# Patient Record
Sex: Male | Born: 2000 | Hispanic: Yes | Marital: Single | Smoking: Never smoker
Health system: Southern US, Community
[De-identification: ages and names within clinical notes are randomized; demographics above are authoritative.]

---
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
## 2021-01-04 ENCOUNTER — Ambulatory Visit
Admission: RE | Admit: 2021-01-04 | Discharge: 2021-01-04 | Disposition: A | Discharge status: Home / Self Care | Payer: Medicaid Other | Source: Ambulatory Visit | Attending: Pediatrics | Admitting: Pediatrics

## 2021-01-04 ENCOUNTER — Other Ambulatory Visit: Payer: Self-pay | Admitting: Pediatrics

## 2021-01-04 DIAGNOSIS — N5082 Scrotal pain: Secondary | ICD-10-CM

## 2021-01-04 DIAGNOSIS — R102 Pelvic and perineal pain: Secondary | ICD-10-CM

## 2022-03-17 ENCOUNTER — Encounter (HOSPITAL_COMMUNITY): Payer: Self-pay

## 2022-03-17 ENCOUNTER — Ambulatory Visit (HOSPITAL_COMMUNITY)
Admission: EM | Admit: 2022-03-17 | Discharge: 2022-03-17 | Disposition: A | Discharge status: Home / Self Care | Payer: Medicaid Other | Attending: Family Medicine | Admitting: Family Medicine

## 2022-03-17 DIAGNOSIS — H66005 Acute suppurative otitis media without spontaneous rupture of ear drum, recurrent, left ear: Secondary | ICD-10-CM | POA: Diagnosis not present

## 2022-03-17 DIAGNOSIS — R051 Acute cough: Secondary | ICD-10-CM | POA: Diagnosis present

## 2022-03-17 DIAGNOSIS — R1013 Epigastric pain: Secondary | ICD-10-CM | POA: Insufficient documentation

## 2022-03-17 DIAGNOSIS — R079 Chest pain, unspecified: Secondary | ICD-10-CM | POA: Insufficient documentation

## 2022-03-17 DIAGNOSIS — Z20822 Contact with and (suspected) exposure to covid-19: Secondary | ICD-10-CM | POA: Insufficient documentation

## 2022-03-17 DIAGNOSIS — J029 Acute pharyngitis, unspecified: Secondary | ICD-10-CM | POA: Diagnosis not present

## 2022-03-17 LAB — SARS CORONAVIRUS 2 (TAT 6-24 HRS): SARS Coronavirus 2: NEGATIVE

## 2022-03-17 MED ORDER — BENZONATATE 100 MG PO CAPS: 100.0000 mg | ORAL_CAPSULE | Freq: Three times a day (TID) | ORAL | 0 refills | Status: AC

## 2022-03-17 MED ORDER — AMOXICILLIN-POT CLAVULANATE 875-125 MG PO TABS: 1.0000 | ORAL_TABLET | Freq: Two times a day (BID) | ORAL | 0 refills | Status: AC

## 2022-03-17 MED ORDER — OMEPRAZOLE 20 MG PO CPDR: 20.0000 mg | DELAYED_RELEASE_CAPSULE | Freq: Every day | ORAL | 0 refills | Status: AC

## 2022-03-17 NOTE — Discharge Instructions (Signed)
You were seen today for upper respiratory symptoms.  ?I have given you an antibiotic today, as well as a cough pill and a medication for your stomach.  Please take as directed.  ?If you take mediation, please take with food to avoid upset stomach.  ?Your covid swab will be resulted tomorrow and if positive we will call and notify you.  You will also see this on mychart.  ?Please return if you are not improving or worsening over time.  ?

## 2022-03-17 NOTE — ED Triage Notes (Signed)
Pt c/o cough, sore throat, nasal/chest congestion with nose bleeds since Sunday. States nose bleed this morning after waking that lasted <38mins. ?

## 2022-03-17 NOTE — ED Provider Notes (Signed)
?[facility] ? ? ? ?CSN: [id number] ?Arrival date & time: 03/17/22  2585 ? ? ?  ? ?History   ?Chief Complaint ?Chief Complaint  ?Patient presents with  ? Cough  ? ? ?HPI ?Fernando Moran is a 21 y.o. male.  ? ?Patient is here for 7 days of URI symptoms.  ?Started with sore throat x 5 days.  Then with cough, runny nose with blood at times.  Yesterday and today had random bloody nose from the left.  Also with chest pain and stomach pain today.  ?No wheezing or sob.  ?No n/v.  ?No diarrhea or constipation.  ?Last week he had fever, slight.  No chills. No other body aches.  ?He did have left ear pain 4 days ago, which resolved.  ?He has been taking otc meds without help.  ? ?History reviewed. No pertinent past medical history. ? ?There are no problems to display for this patient. ? ? ?History reviewed. No pertinent surgical history. ? ? ? ? ?Home Medications   ? ?Prior to Admission medications   ?Medication Sig Start Date End Date Taking? Authorizing Provider  ?diphenhydrAMINE-zinc acetate (BENADRYL) cream Apply topically 3 (three) times daily as needed for itching. 05/31/14   Presson, Mathis Fare, PA  ?famotidine (PEPCID) 20 MG tablet Take 1 tablet (20 mg total) by mouth daily. X 3 days 05/31/14   Presson, Mathis Fare, PA  ?naproxen (NAPROSYN) 250 MG tablet Take 1 tablet (250 mg total) by mouth 2 (two) times daily with a meal. 01/07/17   Deatra Canter, FNP  ? ? ?Family History ?History reviewed. No pertinent family history. ? ?Social History ?Social History  ? ?Tobacco Use  ? Smoking status: Never  ?  Passive exposure: Yes  ? Smokeless tobacco: Never  ?Substance Use Topics  ? Alcohol use: No  ? Drug use: No  ? ? ? ?Allergies   ?Patient has no known allergies. ? ? ?Review of Systems ?Review of Systems  ?Constitutional:  Positive for fever. Negative for chills.  ?HENT:  Positive for congestion, ear pain, rhinorrhea and sore throat.   ?Eyes: Negative.   ?Respiratory:  Positive for cough. Negative  for shortness of breath and wheezing.   ?Gastrointestinal:  Positive for abdominal pain.  ?Genitourinary: Negative.   ?Musculoskeletal: Negative.   ?Skin: Negative.   ? ? ?Physical Exam ?Triage Vital Signs ?ED Triage Vitals [03/17/22 1132]  ?Enc Vitals Group  ?   BP (!) 143/85  ?   Pulse Rate 69  ?   Resp 18  ?   Temp 98.3 ?F (36.8 ?C)  ?   Temp Source Oral  ?   SpO2 97 %  ?   Weight   ?   Height   ?   Head Circumference   ?   Peak Flow   ?   Pain Score 7  ?   Pain Loc   ?   Pain Edu?   ?   Excl. in GC?   ? ?No data found. ? ?Updated Vital Signs ?BP (!) 143/85 (BP Location: Left Arm)   Pulse 69   Temp 98.3 ?F (36.8 ?C) (Oral)   Resp 18   SpO2 97%  ? ?Visual Acuity ?Right Eye Distance:   ?Left Eye Distance:   ?Bilateral Distance:   ? ?Right Eye Near:   ?Left Eye Near:    ?Bilateral Near:    ? ?Physical Exam ?Constitutional:   ?   Appearance: Normal appearance.  ?HENT:  ?  Head: Atraumatic.  ?   Left Ear: Tympanic membrane is erythematous.  ?   Nose:  ?   Right Sinus: No maxillary sinus tenderness or frontal sinus tenderness.  ?   Left Sinus: No maxillary sinus tenderness or frontal sinus tenderness.  ?   Mouth/Throat:  ?   Pharynx: Oropharynx is clear. No oropharyngeal exudate or posterior oropharyngeal erythema.  ?Cardiovascular:  ?   Rate and Rhythm: Normal rate.  ?Pulmonary:  ?   Effort: Pulmonary effort is normal.  ?   Breath sounds: Normal breath sounds.  ?Abdominal:  ?   Palpations: Abdomen is soft.  ?   Tenderness: There is abdominal tenderness in the epigastric area.  ?Musculoskeletal:  ?   Cervical back: Normal range of motion. No tenderness.  ?Lymphadenopathy:  ?   Cervical: No cervical adenopathy.  ?Neurological:  ?   Mental Status: He is alert.  ? ? ? ?UC Treatments / Results  ?Labs ?(all labs ordered are listed, but only abnormal results are displayed) ?Labs Reviewed - No data to display ? ?EKG ? ? ?Radiology ?No results found. ? ?Procedures ?Procedures (including critical care time) ? ?Medications  Ordered in UC ?Medications - No data to display ? ?Initial Impression / Assessment and Plan / UC Course  ?I have reviewed the triage vital signs and the nursing notes. ? ?Pertinent labs & imaging results that were available during my care of the patient were reviewed by me and considered in my medical decision making (see chart for details). ? ? ?Final Clinical Impressions(s) / UC Diagnoses  ? ?Final diagnoses:  ?Recurrent acute suppurative otitis media without spontaneous rupture of left tympanic membrane  ?Acute cough  ?Epigastric pain  ? ? ? ?Discharge Instructions   ? ?  ?You were seen today for upper respiratory symptoms.  ?I have given you an antibiotic today, as well as a cough pill and a medication for your stomach.  Please take as directed.  ?If you take mediation, please take with food to avoid upset stomach.  ?Your covid swab will be resulted tomorrow and if positive we will call and notify you.  You will also see this on mychart.  ?Please return if you are not improving or worsening over time.  ? ? ? ?ED Prescriptions   ? ? Medication Sig Dispense Auth. Provider  ? amoxicillin-clavulanate (AUGMENTIN) 875-125 MG tablet Take 1 tablet by mouth every 12 (twelve) hours. 14 tablet Rhealynn Myhre, MD  ? benzonatate (TESSALON) 100 MG capsule Take 1 capsule (100 mg total) by mouth every 8 (eight) hours. 21 capsule Ferlando Lia, MD  ? omeprazole (PRILOSEC) 20 MG capsule Take 1 capsule (20 mg total) by mouth daily for 14 days. 14 capsule Jannifer Franklin, MD  ? ?  ? ?PDMP not reviewed this encounter. ?  Jannifer Franklin, MD ?03/17/22 1301 ? ?

## 2022-10-28 IMAGING — US US SCROTUM W/ DOPPLER COMPLETE
1 series · 14 of 25 positions shown · non-contrast
Comparison: None.

CLINICAL DATA: Scrotal pain

EXAM:
SCROTAL ULTRASOUND
DOPPLER ULTRASOUND OF THE TESTICLES
TECHNIQUE: Complete ultrasound examination of the testicles, epididymis, and
other scrotal structures was performed. Color and spectral Doppler
ultrasound were also utilized to evaluate blood flow to the
testicles.

[Series 1: us scrotum w/ doppler complete · 0.08mm/px · 14 of 63 slices shown]
[im 1/63]
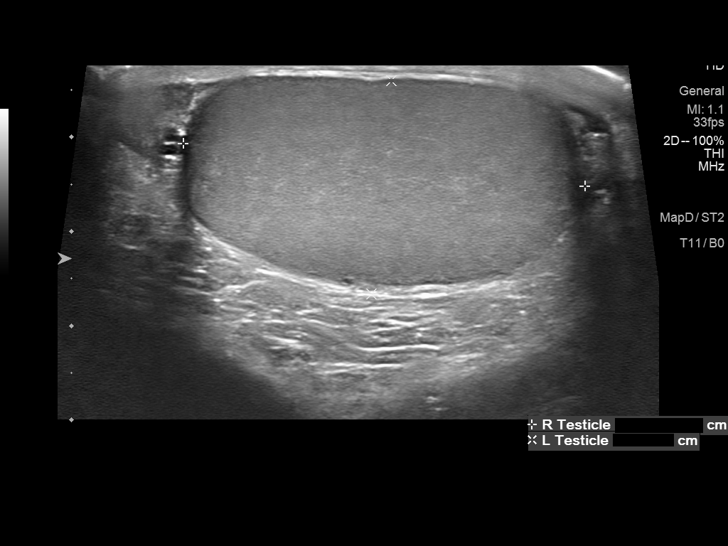
[im 6/63]
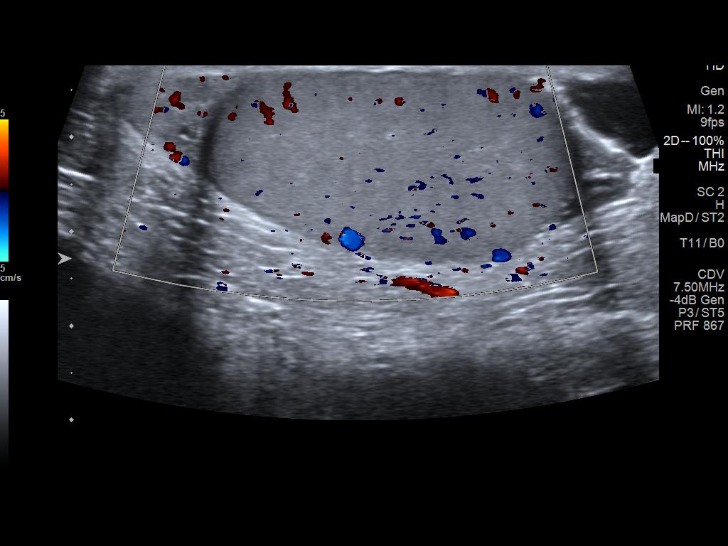
[im 11/63]
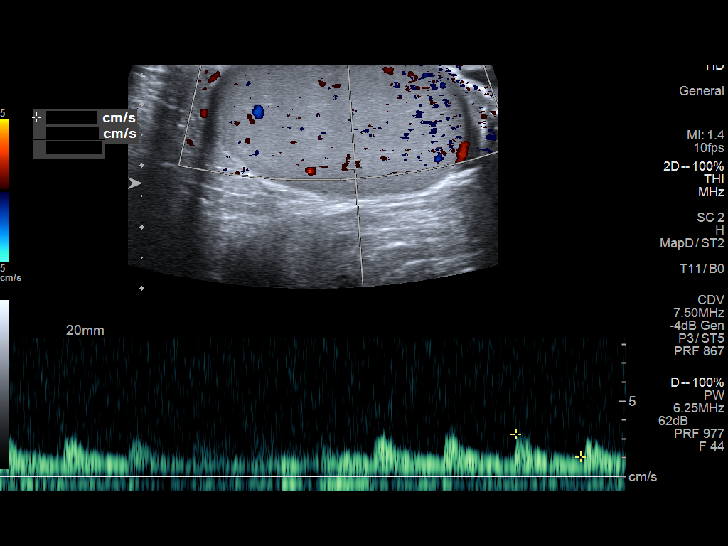
[im 16/63]
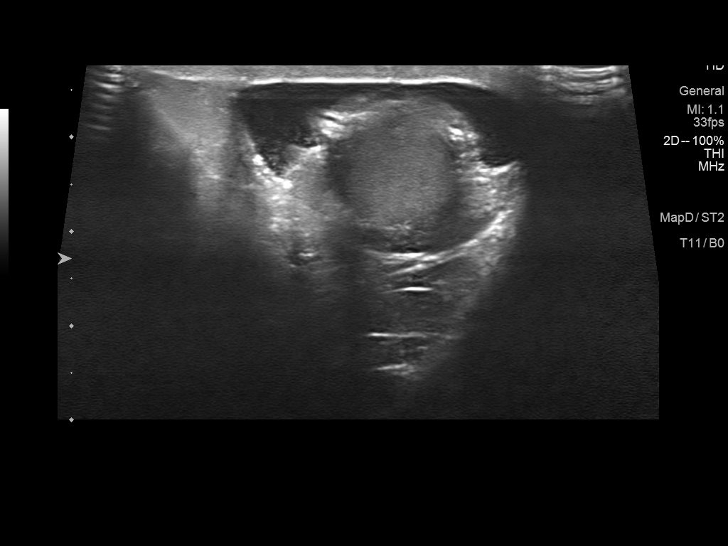
[im 21/63]
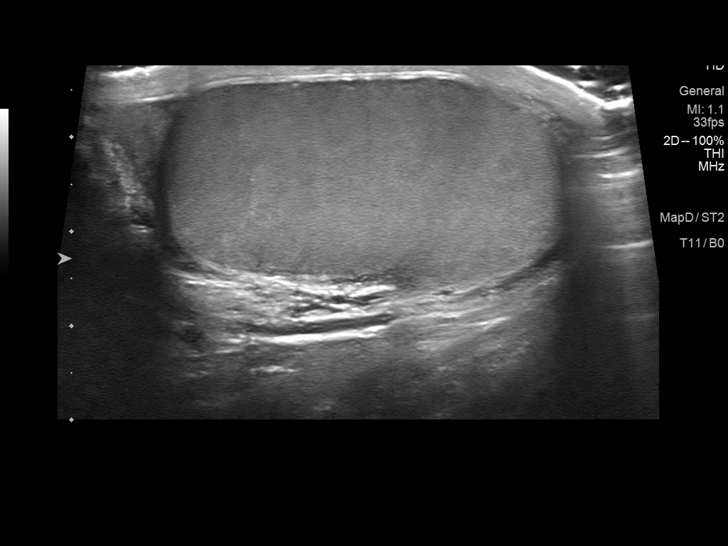
[im 24/63]
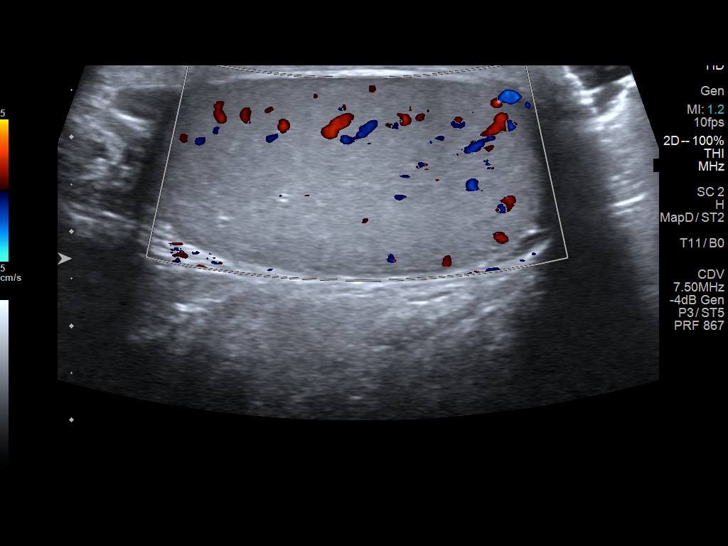
[im 29/63]
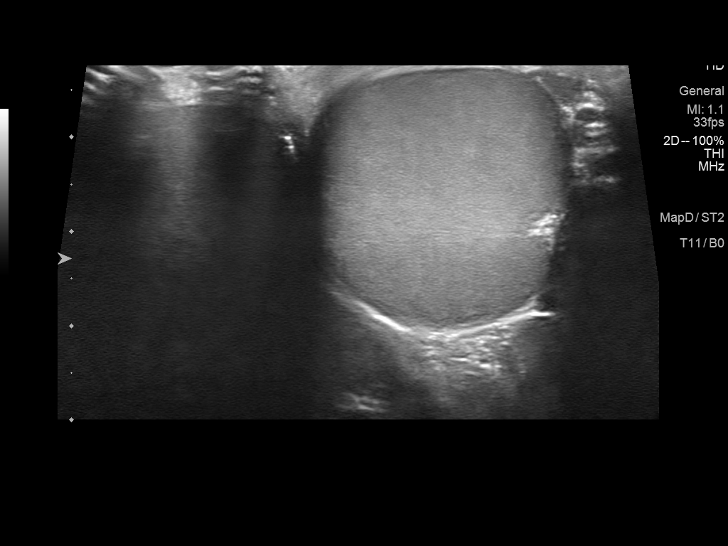
[im 34/63]
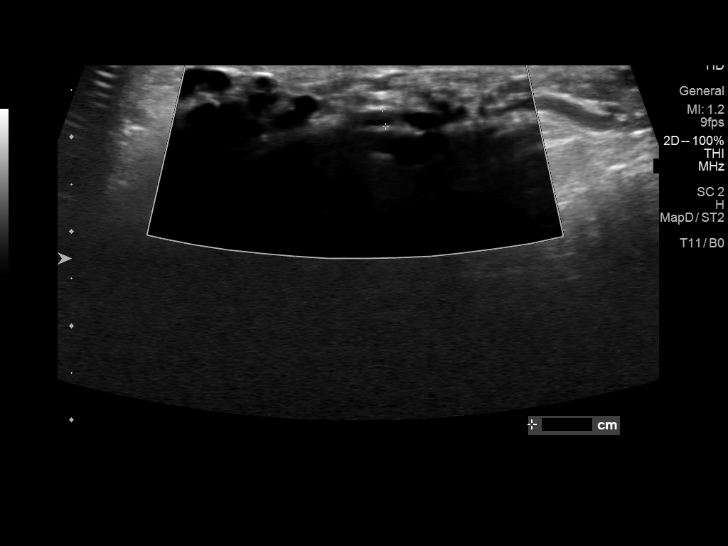
[im 39/63]
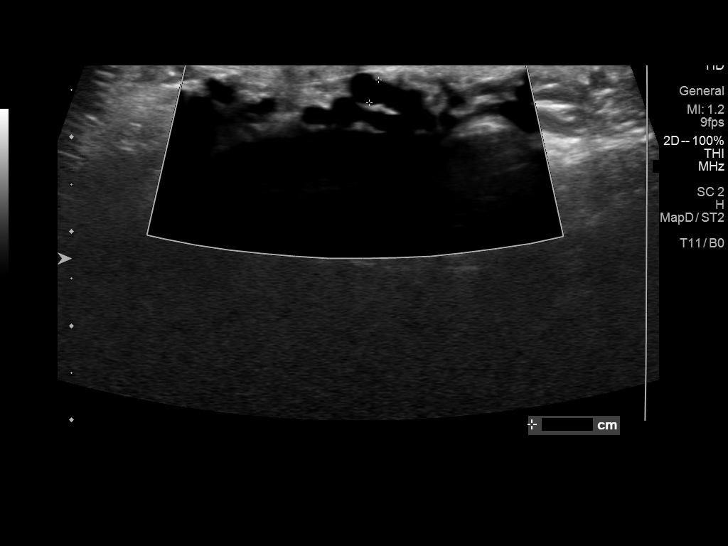
[im 42/63]
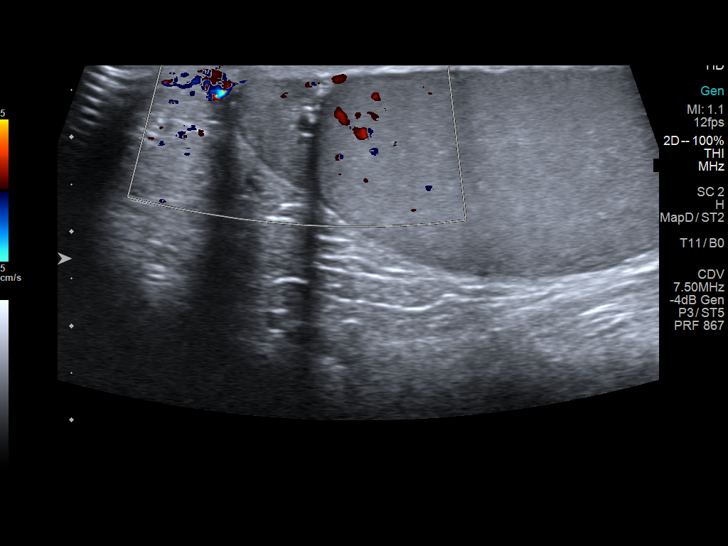
[im 47/63]
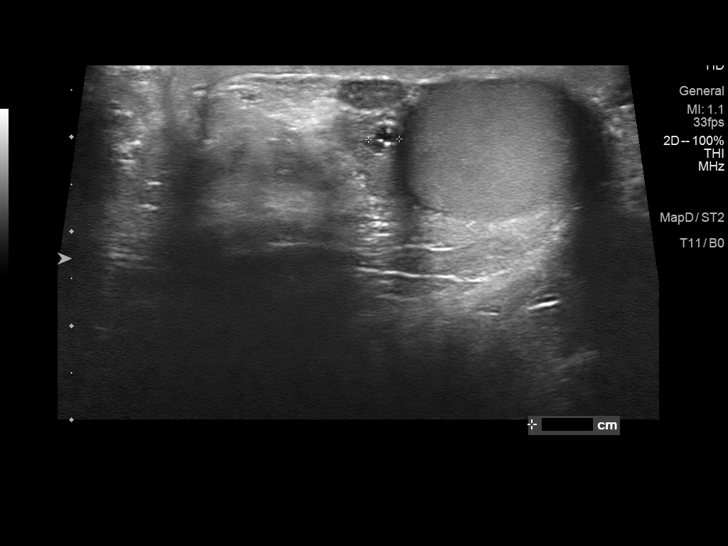
[im 52/63]
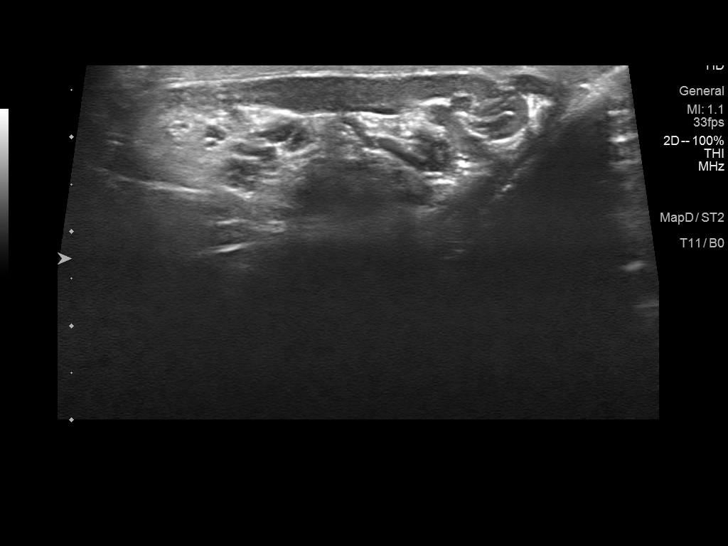
[im 57/63]
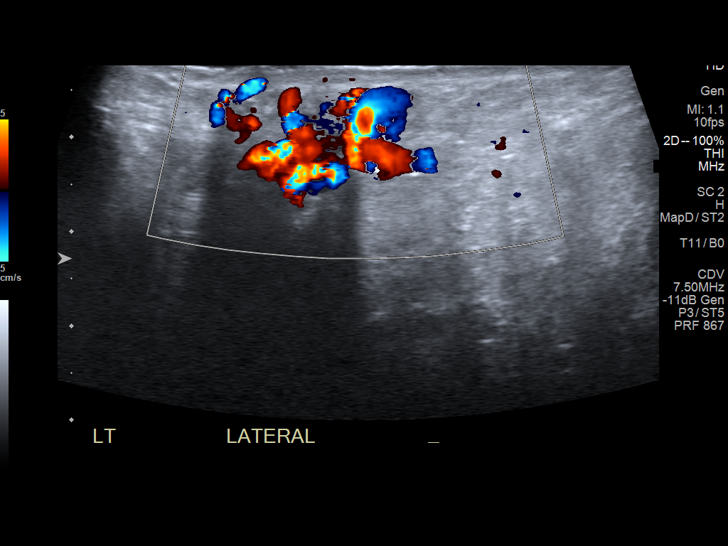
[im 63/63]
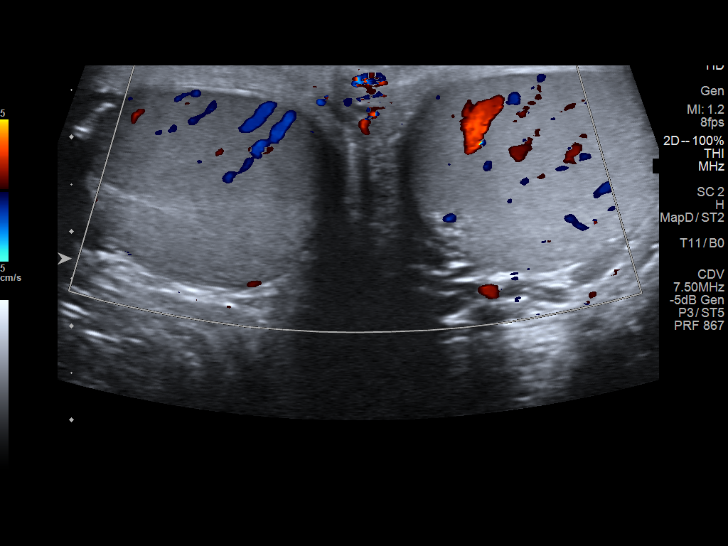

[14 of 25 positions shown; findings below may reference images not displayed]

FINDINGS: Right testicle

Measurements: 4.3 x 2.3 x 2.9 cm. No mass or microlithiasis
visualized.

Left testicle

Measurements: 4.3 x 2.2 x 2.7 cm. No mass or microlithiasis
visualized.

Right epididymis: There is a tiny right-sided epididymal head cyst
measuring 4 mm.

Left epididymis:  Normal in size and appearance.

Hydrocele:  There is a trace right-sided hydrocele.

Varicocele:  There is a left-sided varicocele.

Pulsed Doppler interrogation of both testes demonstrates normal low
resistance arterial and venous waveforms bilaterally.
IMPRESSION: 1. No testicular mass.  No evidence for testicular torsion.
2. There is a left-sided varicocele.
3. There is a small right-sided hydrocele.

## 2022-10-28 IMAGING — US US PELVIS LIMITED
2 series · 14 of 25 positions shown · non-contrast
Comparison: None.

CLINICAL DATA: Concern for inguinal hernia.  Bilateral groin pain

EXAM:
LIMITED ULTRASOUND OF PELVIS
TECHNIQUE: Limited transabdominal ultrasound examination of the pelvis was
performed.

[Series 1: us pelvis limited · 0.06mm/px · 12 of 25 slices shown (1 of 2)]
[im 1/25]
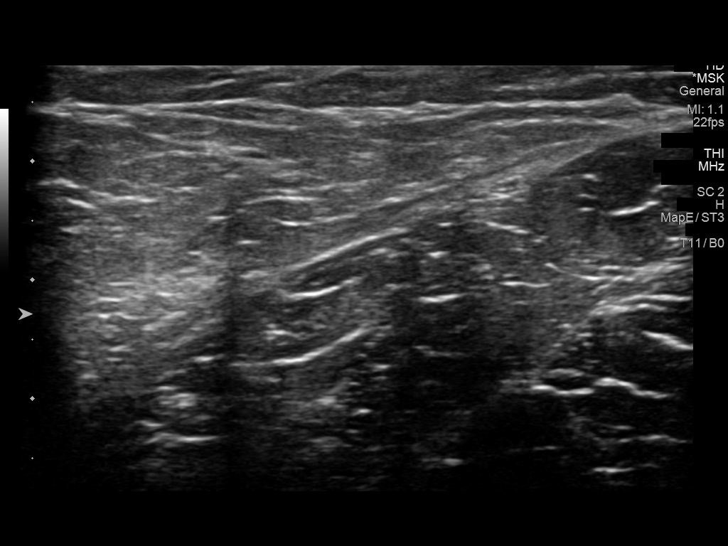
[im 3/25]
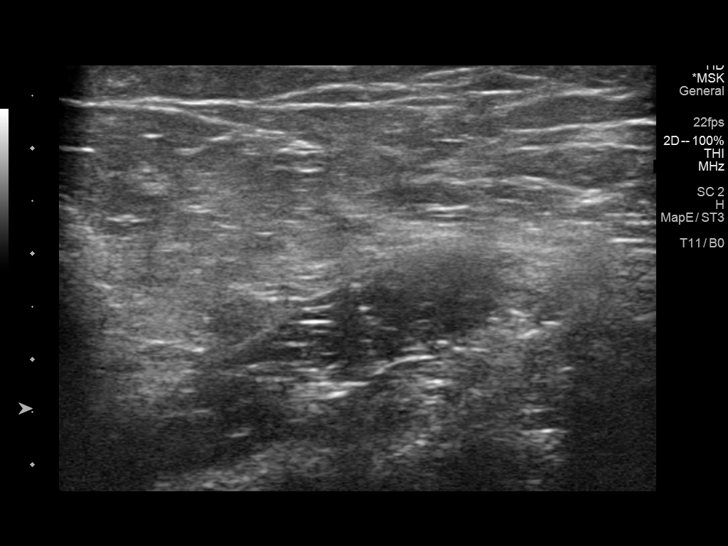
[im 5/25]
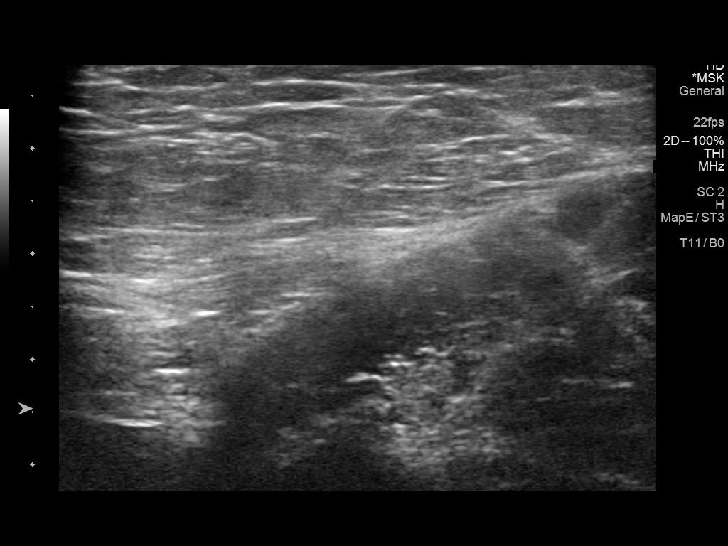
[im 7/25]
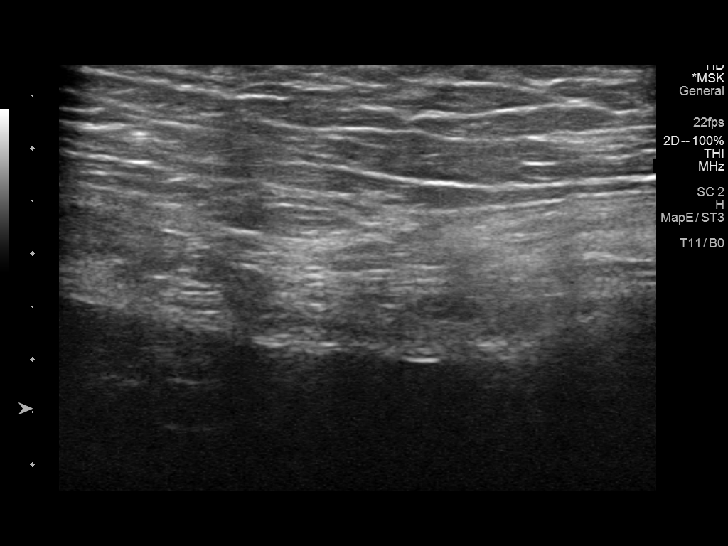
[im 10/25]
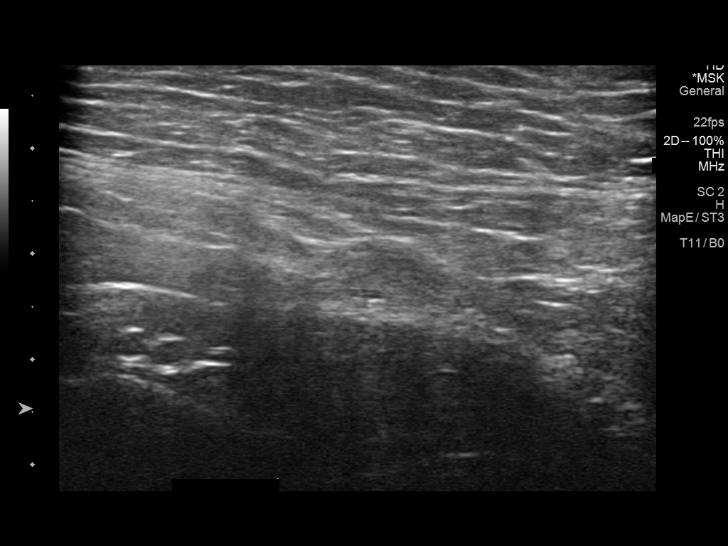
[im 11/25]
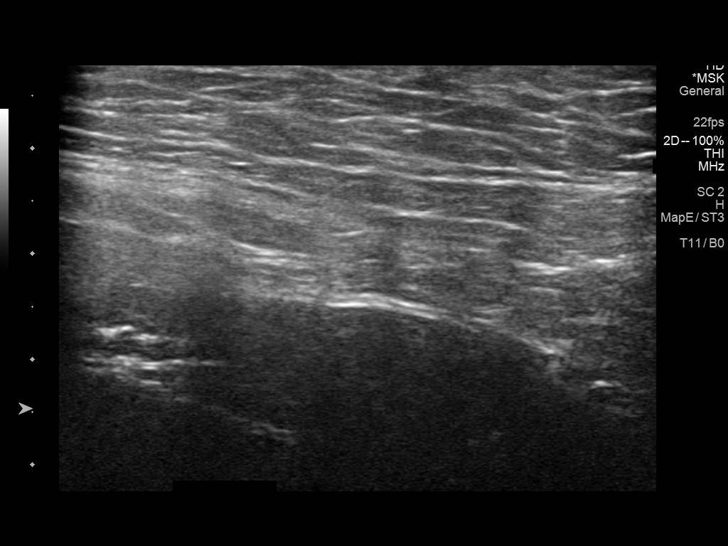
[im 13/25]
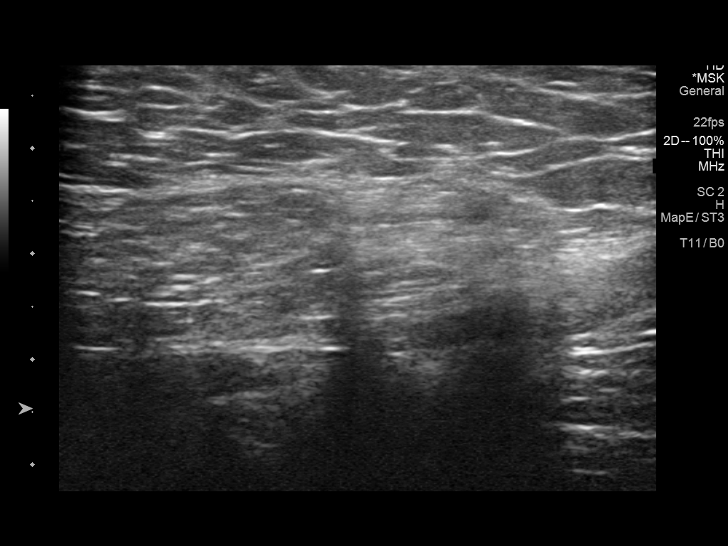
[im 15/25]
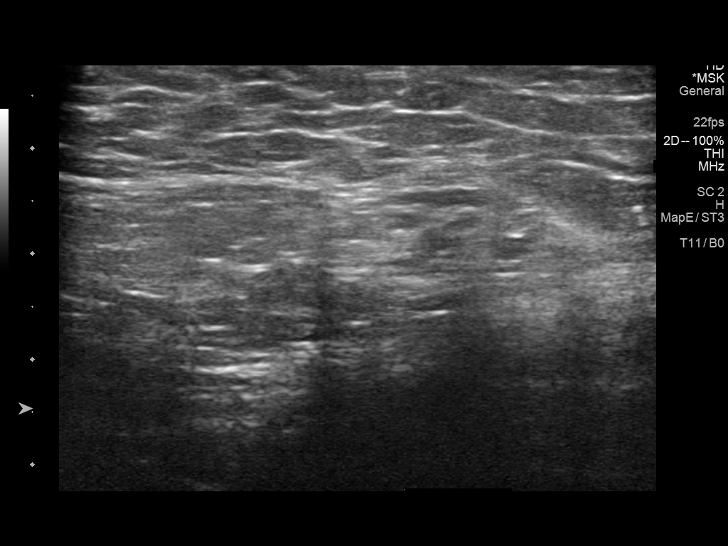
[im 18/25]
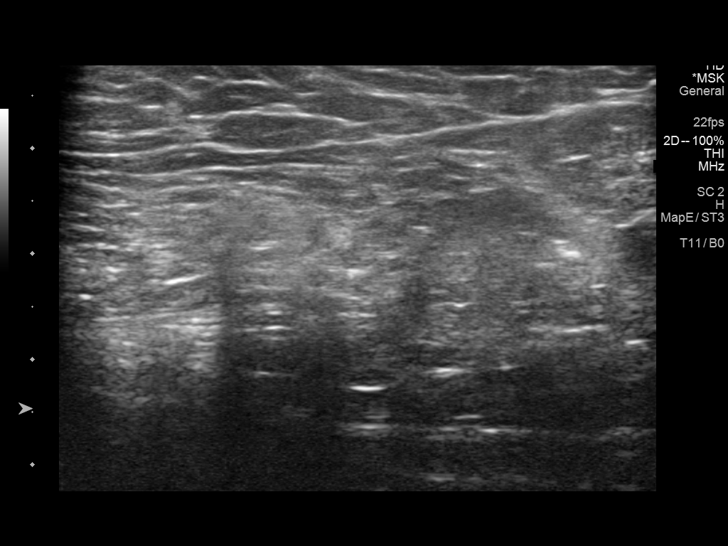
[im 19/25]
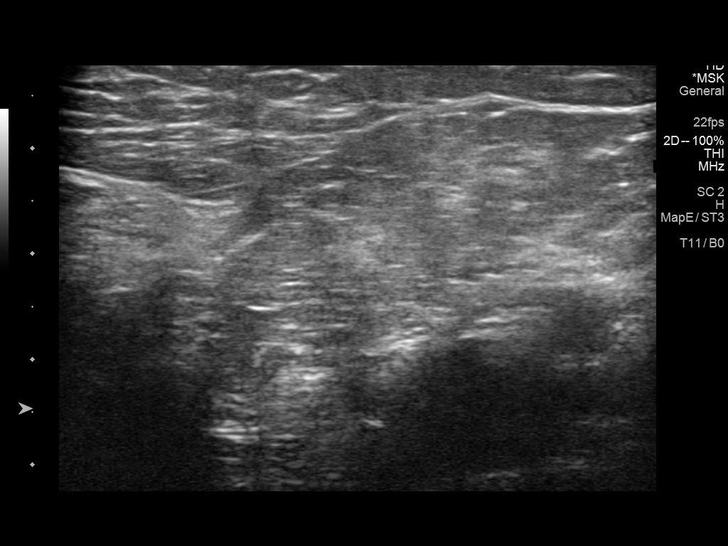
[im 21/25]
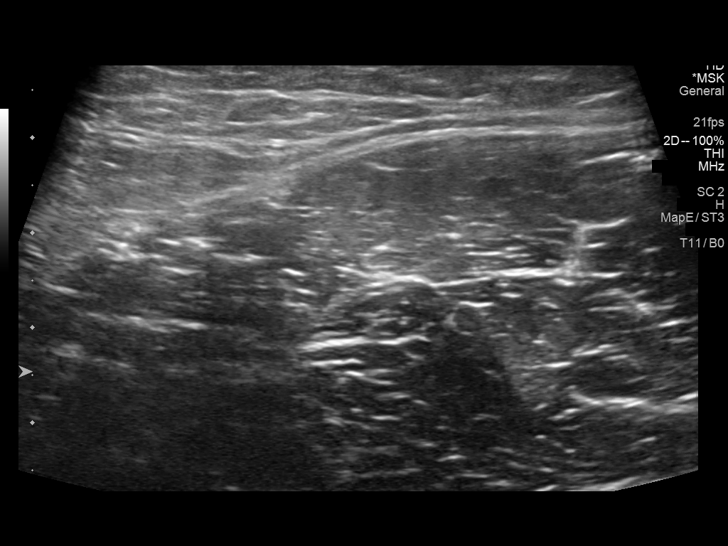
[im 23/25]
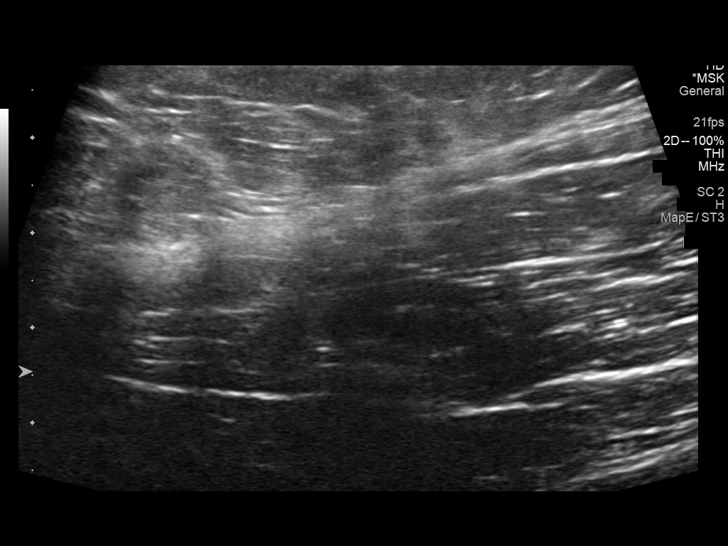

[Series 2001: us pelvis limited · 0.08mm/px · 2 of 4 slices shown (2 of 2)]
[im 1/4]
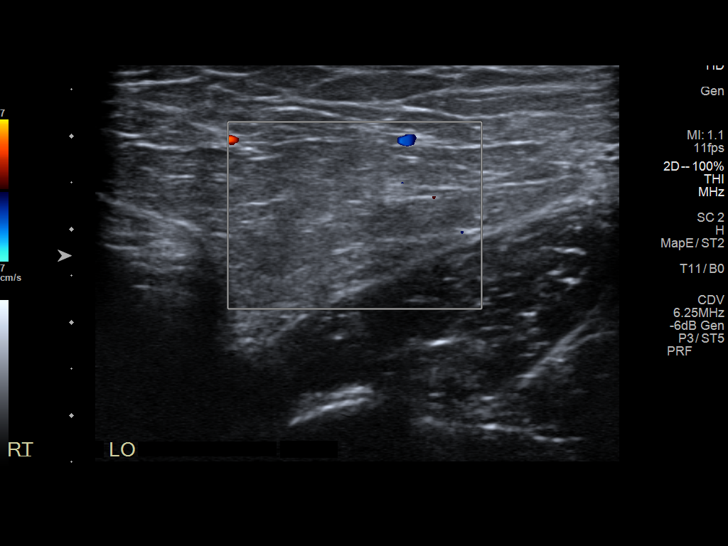
[im 4/4]
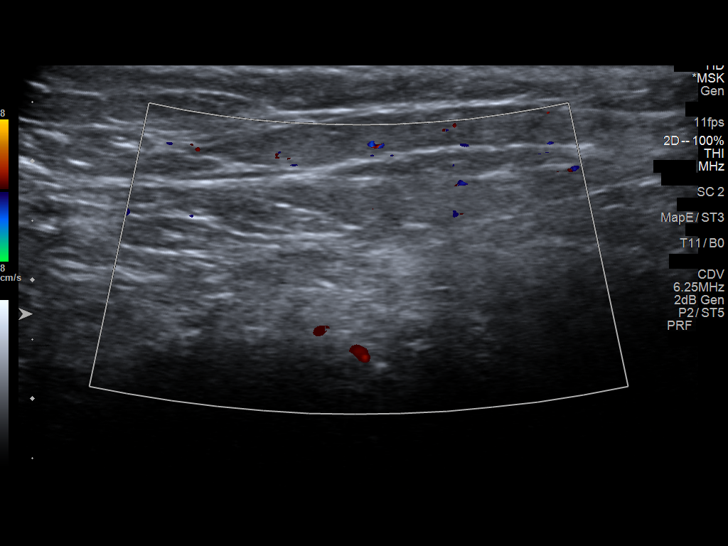

[14 of 25 positions shown; findings below may reference images not displayed]

FINDINGS: Evaluation of the bilateral inguinal region did not demonstrate an
inguinal hernia, mass, or fluid collection.
IMPRESSION: No sonographic abnormality detected.

## 2023-06-08 ENCOUNTER — Ambulatory Visit (HOSPITAL_COMMUNITY)
Admission: EM | Admit: 2023-06-08 | Discharge: 2023-06-08 | Disposition: A | Discharge status: Home / Self Care | Payer: Medicaid Other | Attending: Family Medicine | Admitting: Family Medicine

## 2023-06-08 ENCOUNTER — Encounter (HOSPITAL_COMMUNITY): Payer: Self-pay

## 2023-06-08 DIAGNOSIS — R051 Acute cough: Secondary | ICD-10-CM

## 2023-06-08 DIAGNOSIS — J018 Other acute sinusitis: Secondary | ICD-10-CM

## 2023-06-08 MED ORDER — AZITHROMYCIN 250 MG PO TABS: ORAL_TABLET | ORAL | 0 refills | Status: AC

## 2023-06-08 MED ORDER — PROMETHAZINE-DM 6.25-15 MG/5ML PO SYRP: 5.0000 mL | ORAL_SOLUTION | Freq: Four times a day (QID) | ORAL | 0 refills | Status: AC | PRN

## 2023-06-08 MED ORDER — FLUTICASONE PROPIONATE 50 MCG/ACT NA SUSP: 2.0000 | Freq: Every day | NASAL | 0 refills | Status: AC

## 2023-06-08 NOTE — ED Triage Notes (Signed)
Pt states sore throat, and cough for the past 5 days and left ear pain since yesterday. States he has been taking tylenol at home for the pain.

## 2023-06-08 NOTE — ED Provider Notes (Signed)
[facility]    CSN: [id number] Arrival date & time: 06/08/23  1642      History   Chief Complaint Chief Complaint  Patient presents with   Otalgia    HPI Fernando Moran is a 22 y.o. male.   HPI Patient with a 1 week history of cough, congestion sore throat, bilateral ear fullness and headache. He denies fever, chills, generalized bodyaches.  He denies any known sick contacts.  His cough has been recently productive of mucus.  He has no history of asthma.  History reviewed. No pertinent past medical history.  There are no problems to display for this patient.   History reviewed. No pertinent surgical history.     Home Medications    Prior to Admission medications   Medication Sig Start Date End Date Taking? Authorizing Provider  azithromycin (ZITHROMAX) 250 MG tablet Take 2 tabs PO x 1 dose, then 1 tab PO QD x 4 days 06/08/23  Yes Bing Neighbors, NP  fluticasone (FLONASE) 50 MCG/ACT nasal spray Place 2 sprays into both nostrils daily. 06/08/23  Yes Bing Neighbors, NP  promethazine-dextromethorphan (PROMETHAZINE-DM) 6.25-15 MG/5ML syrup Take 5 mLs by mouth 4 (four) times daily as needed for cough. 06/08/23  Yes Bing Neighbors, NP  amoxicillin-clavulanate (AUGMENTIN) 875-125 MG tablet Take 1 tablet by mouth every 12 (twelve) hours. 03/17/22   Piontek, Denny Peon, MD  benzonatate (TESSALON) 100 MG capsule Take 1 capsule (100 mg total) by mouth every 8 (eight) hours. 03/17/22   Piontek, Denny Peon, MD  omeprazole (PRILOSEC) 20 MG capsule Take 1 capsule (20 mg total) by mouth daily for 14 days. 03/17/22 03/31/22  Jannifer Franklin, MD    Family History History reviewed. No pertinent family history.  Social History Social History   Tobacco Use   Smoking status: Never    Passive exposure: Yes   Smokeless tobacco: Never  Substance Use Topics   Alcohol use: No   Drug use: No     Allergies   Patient has no known allergies.   Review of Systems Review of  Systems Pertinent negatives listed in HPI   Physical Exam Triage Vital Signs ED Triage Vitals  Encounter Vitals Group     BP 06/08/23 1809 120/70     Systolic BP Percentile --      Diastolic BP Percentile --      Pulse Rate 06/08/23 1809 91     Resp 06/08/23 1809 16     Temp 06/08/23 1809 98.5 F (36.9 C)     Temp Source 06/08/23 1809 Oral     SpO2 06/08/23 1809 98 %     Weight --      Height --      Head Circumference --      Peak Flow --      Pain Score 06/08/23 1810 0     Pain Loc --      Pain Education --      Exclude from Growth Chart --    No data found.  Updated Vital Signs BP 120/70 (BP Location: Left Arm)   Pulse 91   Temp 98.5 F (36.9 C) (Oral)   Resp 16   SpO2 98%   Visual Acuity Right Eye Distance:   Left Eye Distance:   Bilateral Distance:    Right Eye Near:   Left Eye Near:    Bilateral Near:     Physical Exam Vitals reviewed.  Constitutional:      Appearance: Normal appearance.  HENT:     Head: Normocephalic and atraumatic.     Right Ear: A middle ear effusion is present.     Left Ear: A middle ear effusion is present.     Nose: Congestion and rhinorrhea present.  Eyes:     Extraocular Movements: Extraocular movements intact.     Conjunctiva/sclera: Conjunctivae normal.     Pupils: Pupils are equal, round, and reactive to light.  Cardiovascular:     Rate and Rhythm: Normal rate and regular rhythm.  Pulmonary:     Effort: Pulmonary effort is normal.     Breath sounds: Normal breath sounds.  Musculoskeletal:     Cervical back: Normal range of motion and neck supple.  Lymphadenopathy:     Cervical: No cervical adenopathy.  Neurological:     General: No focal deficit present.     Mental Status: He is alert.      UC Treatments / Results  Labs (all labs ordered are listed, but only abnormal results are displayed) Labs Reviewed - No data to display  EKG   Radiology No results found.  Procedures Procedures (including  critical care time)  Medications Ordered in UC Medications - No data to display  Initial Impression / Assessment and Plan / UC Course  I have reviewed the triage vital signs and the nursing notes.  Pertinent labs & imaging results that were available during my care of the patient were reviewed by me and considered in my medical decision making (see chart for details).    Acute sinusitis with cough, no fever, treatment per discharge medication orders.  Strict return precautions given if symptoms do not improve following completion of medication. Final Clinical Impressions(s) / UC Diagnoses   Final diagnoses:  Acute non-recurrent sinusitis of other sinus  Acute cough   Discharge Instructions   None    ED Prescriptions     Medication Sig Dispense Auth. Provider   azithromycin (ZITHROMAX) 250 MG tablet Take 2 tabs PO x 1 dose, then 1 tab PO QD x 4 days 6 tablet Bing Neighbors, NP   promethazine-dextromethorphan (PROMETHAZINE-DM) 6.25-15 MG/5ML syrup Take 5 mLs by mouth 4 (four) times daily as needed for cough. 180 mL Bing Neighbors, NP   fluticasone (FLONASE) 50 MCG/ACT nasal spray Place 2 sprays into both nostrils daily. 11.1 mL Bing Neighbors, NP      PDMP not reviewed this encounter.   Bing Neighbors, NP 06/08/23 [phone]

## 2023-11-22 ENCOUNTER — Ambulatory Visit (HOSPITAL_COMMUNITY): Payer: Self-pay
